# Patient Record
Sex: Male | Born: 1980 | Race: White | Hispanic: No | Marital: Married | Smoking: Former smoker
Health system: Southern US, Community
[De-identification: ages and names within clinical notes are randomized; demographics above are authoritative.]

## PROBLEM LIST (undated history)

## (undated) DIAGNOSIS — I10 Essential (primary) hypertension: Secondary | ICD-10-CM

## (undated) DIAGNOSIS — N200 Calculus of kidney: Secondary | ICD-10-CM

## (undated) HISTORY — PX: HERNIA REPAIR: SHX51

## (undated) HISTORY — PX: TONSILLECTOMY: SUR1361

## (undated) HISTORY — PX: WISDOM TOOTH EXTRACTION: SHX21

## (undated) HISTORY — PX: BACK SURGERY: SHX140

---

## 2016-11-11 ENCOUNTER — Encounter: Payer: Self-pay | Admitting: Emergency Medicine

## 2016-11-11 ENCOUNTER — Emergency Department (INDEPENDENT_AMBULATORY_CARE_PROVIDER_SITE_OTHER)
Admission: EM | Admit: 2016-11-11 | Discharge: 2016-11-11 | Disposition: A | Discharge status: Home / Self Care | Payer: Commercial Managed Care - PPO | Source: Home / Self Care | Attending: Family Medicine | Admitting: Family Medicine

## 2016-11-11 DIAGNOSIS — M791 Myalgia: Secondary | ICD-10-CM

## 2016-11-11 DIAGNOSIS — J111 Influenza due to unidentified influenza virus with other respiratory manifestations: Secondary | ICD-10-CM

## 2016-11-11 DIAGNOSIS — R05 Cough: Secondary | ICD-10-CM

## 2016-11-11 DIAGNOSIS — R51 Headache: Secondary | ICD-10-CM | POA: Diagnosis not present

## 2016-11-11 DIAGNOSIS — R509 Fever, unspecified: Secondary | ICD-10-CM | POA: Diagnosis not present

## 2016-11-11 DIAGNOSIS — R69 Illness, unspecified: Principal | ICD-10-CM

## 2016-11-11 HISTORY — DX: Essential (primary) hypertension: I10

## 2016-11-11 HISTORY — DX: Calculus of kidney: N20.0

## 2016-11-11 MED ORDER — OSELTAMIVIR PHOSPHATE 75 MG PO CAPS: 75.0000 mg | ORAL_CAPSULE | Freq: Two times a day (BID) | ORAL | 0 refills | Status: DC

## 2016-11-11 MED ORDER — GUAIFENESIN-CODEINE 100-10 MG/5ML PO SOLN: ORAL | 0 refills | Status: DC

## 2016-11-11 NOTE — ED Provider Notes (Signed)
[facility]    CSN: [id number] Arrival date & time: 11/11/16  1811     History   Chief Complaint Chief Complaint  Patient presents with  . Influenza    HPI Delphia GratesDerek Cain is a 36 y.o. male.   Complains of 2 day history flu-like illness including myalgias, headache, fever/chills, fatigue, and cough.  Also has mild nasal congestion and sore throat.  Cough is non-productive and somewhat worse at night.  No pleuritic pain or shortness of breath.  He has had loose stools.   The history is provided by the patient and the spouse.    Past Medical History:  Diagnosis Date  . Hypertension   . Kidney stones     There are no active problems to display for this patient.   Past Surgical History:  Procedure Laterality Date  . BACK SURGERY    . HERNIA REPAIR    . TONSILLECTOMY    . WISDOM TOOTH EXTRACTION         Home Medications    Prior to Admission medications   Medication Sig Start Date End Date Taking? Authorizing Provider  losartan (COZAAR) 50 MG tablet Take 50 mg by mouth daily.   Yes Historical Provider, MD  guaiFENesin-codeine 100-10 MG/5ML syrup Take 10mL by mouth at bedtime as needed for cough 11/11/16   Lattie HawStephen A Beese, MD  oseltamivir (TAMIFLU) 75 MG capsule Take 1 capsule (75 mg total) by mouth every 12 (twelve) hours. 11/11/16   Lattie HawStephen A Beese, MD    Family History No family history on file.  Social History Social History  Substance Use Topics  . Smoking status: Former Games developermoker  . Smokeless tobacco: Never Used  . Alcohol use Yes     Allergies   Patient has no known allergies.   Review of Systems Review of Systems + sore throat + cough No pleuritic pain No wheezing + nasal congestion + post-nasal drainage No sinus pain/pressure No itchy/red eyes No earache No hemoptysis No SOB No fever, + chills/sweats No nausea No vomiting No abdominal pain + diarrhea No urinary symptoms No skin rash + fatigue + myalgias +  headache Used OTC meds without relief   Physical Exam Triage Vital Signs ED Triage Vitals  Enc Vitals Group     BP 11/11/16 1855 129/91     Pulse Rate 11/11/16 1855 95     Resp --      Temp 11/11/16 1855 98 F (36.7 C)     Temp Source 11/11/16 1855 Oral     SpO2 11/11/16 1855 98 %     Weight 11/11/16 1855 211 lb (95.7 kg)     Height 11/11/16 1855 6' (1.829 m)     Head Circumference --      Peak Flow --      Pain Score 11/11/16 1900 2     Pain Loc --      Pain Edu? --      Excl. in GC? --    No data found.   Updated Vital Signs BP 129/91 (BP Location: Left Arm)   Pulse 95   Temp 98 F (36.7 C) (Oral)   Ht 6' (1.829 m)   Wt 211 lb (95.7 kg)   SpO2 98%   BMI 28.62 kg/m   Visual Acuity Right Eye Distance:   Left Eye Distance:   Bilateral Distance:    Right Eye Near:   Left Eye Near:    Bilateral Near:     Physical Exam  Nursing notes and Vital Signs reviewed. Appearance:  Patient appears stated age, and in no acute distress Eyes:  Pupils are equal, round, and reactive to light and accomodation.  Extraocular movement is intact.  Conjunctivae are not inflamed  Ears:  Canals normal.  Tympanic membranes normal.  Nose:  Mildly congested turbinates.  No sinus tenderness  Pharynx:  Normal Neck:  Supple.  Tender enlarged posterior/lateral nodes are palpated bilaterally  Lungs:  Clear to auscultation.  Breath sounds are equal.  Moving air well. Heart:  Regular rate and rhythm without murmurs, rubs, or gallops.  Abdomen:  Nontender without masses or hepatosplenomegaly.  Bowel sounds are present.  No CVA or flank tenderness.  Extremities:  No edema.  Skin:  No rash present.    UC Treatments / Results  Labs (all labs ordered are listed, but only abnormal results are displayed) Labs Reviewed - No data to display  EKG  EKG Interpretation None       Radiology No results found.  Procedures Procedures (including critical care time)  Medications Ordered in  UC Medications - No data to display   Initial Impression / Assessment and Plan / UC Course  I have reviewed the triage vital signs and the nursing notes.  Pertinent labs & imaging results that were available during my care of the patient were reviewed by me and considered in my medical decision making (see chart for details).    Begin Tamiflu. Rx for Robitussin AC for night time cough.  Take plain guaifenesin (1200mg  extended release tabs such as Mucinex) twice daily, with plenty of water, for cough and congestion.  Get adequate rest.   May use Afrin nasal spray (or generic oxymetazoline) twice daily for about 5 days and then discontinue.  Also recommend using saline nasal spray several times daily and saline nasal irrigation (AYR is a common brand).  Use Flonase nasal spray each morning after using Afrin nasal spray and saline nasal irrigation. Try warm salt water gargles for sore throat.  Stop all antihistamines for now, and other non-prescription cough/cold preparations. May take Ibuprofen 200mg , 4 tabs every 8 hours with food for body aches, headache, etc.  Begin clear liquids (Pedialyte while having diarrhea) until improved, then advance to a [employer] (Bananas, Rice, Applesauce, Toast).  Then gradually resume a regular diet when tolerated.  Avoid milk products until well.    If symptoms become significantly worse during the night or over the weekend, proceed to the local emergency room.  Followup with Family Doctor if not improved in one week.        Final Clinical Impressions(s) / UC Diagnoses   Final diagnoses:  Influenza-like illness    New Prescriptions New Prescriptions   GUAIFENESIN-CODEINE 100-10 MG/5ML SYRUP    Take 10mL by mouth at bedtime as needed for cough   OSELTAMIVIR (TAMIFLU) 75 MG CAPSULE    Take 1 capsule (75 mg total) by mouth every 12 (twelve) hours.     Lattie Haw, MD 11/11/16 [phone]

## 2016-11-11 NOTE — Discharge Instructions (Signed)
Take plain guaifenesin (1200mg  extended release tabs such as Mucinex) twice daily, with plenty of water, for cough and congestion.  Get adequate rest.   May use Afrin nasal spray (or generic oxymetazoline) twice daily for about 5 days and then discontinue.  Also recommend using saline nasal spray several times daily and saline nasal irrigation (AYR is a common brand).  Use Flonase nasal spray each morning after using Afrin nasal spray and saline nasal irrigation. Try warm salt water gargles for sore throat.  Stop all antihistamines for now, and other non-prescription cough/cold preparations. May take Ibuprofen 200mg , 4 tabs every 8 hours with food for body aches, headache, etc.  Begin clear liquids (Pedialyte while having diarrhea) until improved, then advance to a [employer] diet (Bananas, Rice, Applesauce, Toast).  Then gradually resume a regular diet when tolerated.  Avoid milk products until well.    If symptoms become significantly worse during the night or over the weekend, proceed to the local emergency room.

## 2016-11-11 NOTE — ED Triage Notes (Signed)
Cough, body aches, chest hurts, headache, congestion x 2 days

## 2016-11-25 ENCOUNTER — Other Ambulatory Visit: Payer: Self-pay | Admitting: Neurosurgery

## 2016-11-25 DIAGNOSIS — M5412 Radiculopathy, cervical region: Secondary | ICD-10-CM

## 2016-11-25 DIAGNOSIS — M5416 Radiculopathy, lumbar region: Secondary | ICD-10-CM

## 2016-12-09 ENCOUNTER — Ambulatory Visit (INDEPENDENT_AMBULATORY_CARE_PROVIDER_SITE_OTHER): Payer: Commercial Managed Care - PPO

## 2016-12-09 DIAGNOSIS — M4802 Spinal stenosis, cervical region: Secondary | ICD-10-CM

## 2016-12-09 DIAGNOSIS — M50121 Cervical disc disorder at C4-C5 level with radiculopathy: Secondary | ICD-10-CM

## 2016-12-09 DIAGNOSIS — M5416 Radiculopathy, lumbar region: Secondary | ICD-10-CM

## 2016-12-09 DIAGNOSIS — M5412 Radiculopathy, cervical region: Secondary | ICD-10-CM

## 2016-12-09 DIAGNOSIS — M5126 Other intervertebral disc displacement, lumbar region: Secondary | ICD-10-CM

## 2017-11-25 ENCOUNTER — Emergency Department (INDEPENDENT_AMBULATORY_CARE_PROVIDER_SITE_OTHER)
Admission: EM | Admit: 2017-11-25 | Discharge: 2017-11-25 | Disposition: A | Discharge status: Home / Self Care | Payer: Commercial Managed Care - PPO | Source: Home / Self Care | Attending: Family Medicine | Admitting: Family Medicine

## 2017-11-25 ENCOUNTER — Encounter: Payer: Self-pay | Admitting: *Deleted

## 2017-11-25 ENCOUNTER — Other Ambulatory Visit: Payer: Self-pay

## 2017-11-25 DIAGNOSIS — J111 Influenza due to unidentified influenza virus with other respiratory manifestations: Secondary | ICD-10-CM | POA: Diagnosis not present

## 2017-11-25 DIAGNOSIS — R69 Illness, unspecified: Principal | ICD-10-CM

## 2017-11-25 MED ORDER — BENZONATATE 200 MG PO CAPS: ORAL_CAPSULE | ORAL | 0 refills | Status: DC

## 2017-11-25 MED ORDER — OSELTAMIVIR PHOSPHATE 75 MG PO CAPS: 75.0000 mg | ORAL_CAPSULE | Freq: Two times a day (BID) | ORAL | 0 refills | Status: DC

## 2017-11-25 NOTE — ED Provider Notes (Signed)
[facility]    CSN: [id number] Arrival date & time: 11/25/17  1103     History   Chief Complaint Chief Complaint  Patient presents with  . Headache  . Generalized Body Aches    HPI Jorge Cain is a 37 y.o. male.   Patient developed nasal congestion two days ago.  Yesterday he developed increased sinus congestion, headache, chest tightness, cough, myalgias, fatigue, and sweats.  No pleuritic pain or shortness of breath.   The history is provided by the patient.    Past Medical History:  Diagnosis Date  . Hypertension   . Kidney stones     There are no active problems to display for this patient.   Past Surgical History:  Procedure Laterality Date  . BACK SURGERY    . HERNIA REPAIR    . TONSILLECTOMY    . WISDOM TOOTH EXTRACTION         Home Medications    Prior to Admission medications   Medication Sig Start Date End Date Taking? Authorizing Provider  benzonatate (TESSALON) 200 MG capsule Take one cap by mouth at bedtime as needed for cough.  May repeat in 4 to 6 hours 11/25/17   Lattie HawBeese, Aasiyah Auerbach A, MD  losartan (COZAAR) 50 MG tablet Take 50 mg by mouth daily.    [provider]  oseltamivir (TAMIFLU) 75 MG capsule Take 1 capsule (75 mg total) by mouth every 12 (twelve) hours. 11/25/17   Lattie HawBeese, Iretta Mangrum A, MD    Family History History reviewed. No pertinent family history.  Social History Social History   Tobacco Use  . Smoking status: Former Games developermoker  . Smokeless tobacco: Never Used  Substance Use Topics  . Alcohol use: Yes  . Drug use: No     Allergies   Patient has no known allergies.   Review of Systems Review of Systems + sore throat + cough No pleuritic pain No wheezing + nasal congestion + post-nasal drainage No sinus pain/pressure No itchy/red eyes ? earache No hemoptysis No SOB No fever, + chills/sweats No nausea No vomiting No abdominal pain No diarrhea No urinary symptoms No skin rash + fatigue +  myalgias + headache Used OTC meds without relief   Physical Exam Triage Vital Signs ED Triage Vitals  Enc Vitals Group     BP 11/25/17 1152 (!) 132/95     Pulse Rate 11/25/17 1152 78     Resp 11/25/17 1152 18     Temp 11/25/17 1152 97.9 F (36.6 C)     Temp Source 11/25/17 1152 Oral     SpO2 11/25/17 1152 97 %     Weight 11/25/17 1153 222 lb (100.7 kg)     Height 11/25/17 1153 6' (1.829 m)     Head Circumference --      Peak Flow --      Pain Score 11/25/17 1153 0     Pain Loc --      Pain Edu? --      Excl. in GC? --    No data found.  Updated Vital Signs BP (!) 132/95 (BP Location: Right Arm)   Pulse 78   Temp 97.9 F (36.6 C) (Oral)   Resp 18   Ht 6' (1.829 m)   Wt 222 lb (100.7 kg)   SpO2 97%   BMI 30.11 kg/m   Visual Acuity Right Eye Distance:   Left Eye Distance:   Bilateral Distance:    Right Eye Near:   Left Eye  Near:    Bilateral Near:     Physical Exam Nursing notes and Vital Signs reviewed. Appearance:  Patient appears stated age, and in no acute distress Eyes:  Pupils are equal, round, and reactive to light and accomodation.  Extraocular movement is intact.  Conjunctivae are not inflamed  Ears:  Canals normal.  Tympanic membranes normal.  Nose:  Mildly congested turbinates.  No sinus tenderness.  Pharynx:  Normal Neck:  Supple.  Enlarged posterior/lateral nodes are palpated bilaterally, tender to palpation on the left.   Lungs:  Clear to auscultation.  Breath sounds are equal.  Moving air well. Chest:  Distinct tenderness to palpation over the mid-sternum.  Heart:  Regular rate and rhythm without murmurs, rubs, or gallops.  Abdomen:  Nontender without masses or hepatosplenomegaly.  Bowel sounds are present.  No CVA or flank tenderness.  Extremities:  No edema.  Skin:  No rash present.    UC Treatments / Results  Labs (all labs ordered are listed, but only abnormal results are displayed) Labs Reviewed - No data to display  EKG  EKG  Interpretation None       Radiology No results found.  Procedures Procedures (including critical care time)  Medications Ordered in UC Medications - No data to display   Initial Impression / Assessment and Plan / UC Course  I have reviewed the triage vital signs and the nursing notes.  Pertinent labs & imaging results that were available during my care of the patient were reviewed by me and considered in my medical decision making (see chart for details).    Begin Tamiflu. Prescription written for Benzonatate [facility]) to take at bedtime for night-time cough.  Take plain guaifenesin (1200mg  extended release tabs such as Mucinex) twice daily, with plenty of water, for cough and congestion.  Get adequate rest.   May use Afrin nasal spray (or generic oxymetazoline) each morning for about 5 days and then discontinue.  Also recommend using saline nasal spray several times daily and saline nasal irrigation (AYR is a common brand).  Use Flonase nasal spray each morning after using Afrin nasal spray and saline nasal irrigation. Try warm salt water gargles for sore throat.  Stop all antihistamines for now, and other non-prescription cough/cold preparations. May take Ibuprofen 200mg , 4 tabs every 8 hours with food for chest/sternum discomfort, headache, fever, etc. May take Delsym Cough Suppressant with Tessalon at bedtime for nighttime cough.   Followup with Family Doctor if not improved in one week.     Final Clinical Impressions(s) / UC Diagnoses   Final diagnoses:  Influenza-like illness    ED Discharge Orders        Ordered    oseltamivir (TAMIFLU) 75 MG capsule  Every 12 hours     11/25/17 1247    benzonatate (TESSALON) 200 MG capsule     11/25/17 1247          Lattie Haw, MD 11/25/17 1252

## 2017-11-25 NOTE — ED Triage Notes (Signed)
Pt c/o body aches, HA, runny nose and sweats x 1 day. No OTC meds.

## 2017-11-25 NOTE — Discharge Instructions (Signed)
Take plain guaifenesin (1200mg  extended release tabs such as Mucinex) twice daily, with plenty of water, for cough and congestion.  Get adequate rest.   May use Afrin nasal spray (or generic oxymetazoline) each morning for about 5 days and then discontinue.  Also recommend using saline nasal spray several times daily and saline nasal irrigation (AYR is a common brand).  Use Flonase nasal spray each morning after using Afrin nasal spray and saline nasal irrigation. Try warm salt water gargles for sore throat.  Stop all antihistamines for now, and other non-prescription cough/cold preparations. May take Ibuprofen 200mg , 4 tabs every 8 hours with food for chest/sternum discomfort, headache, fever, etc. May take Delsym Cough Suppressant with Tessalon at bedtime for nighttime cough.

## 2019-02-01 IMAGING — MR MR CERVICAL SPINE W/O CM
5 series · 33 of 48 positions shown · non-contrast
Comparison: Plain films cervical spine 11/20/2016 performed at
[HOSPITAL] [REDACTED].

CLINICAL DATA: Neck pain, popping and stiffness radiating into the
left arm to the hand. No known injury.

EXAM:
MRI CERVICAL SPINE WITHOUT CONTRAST
TECHNIQUE: Multiplanar, multisequence MR imaging of the cervical spine was
performed. No intravenous contrast was administered.

[Series 3: T1 · sagittal · 3.0mm · 0.70mm/px · 6 of 13 slices shown]
[im 1/13]
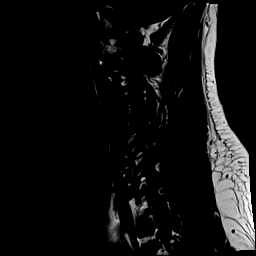
[im 3/13]
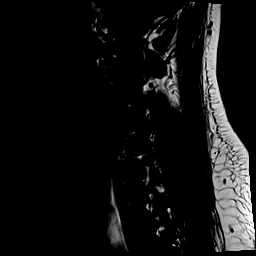
[im 5/13]
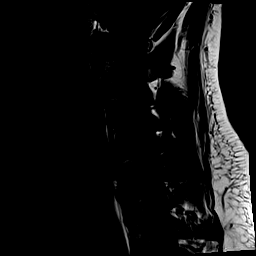
[im 8/13]
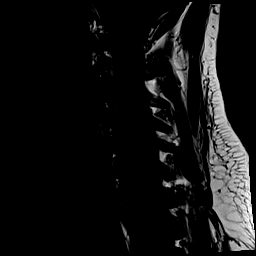
[im 10/13]
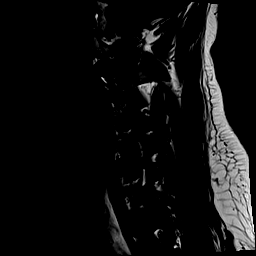
[im 13/13]
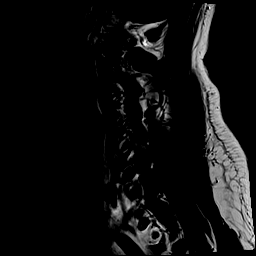

[Series 4: STIR · sagittal · 3.0mm · 0.35mm/px · 7 of 13 slices shown]
[im 1/13]
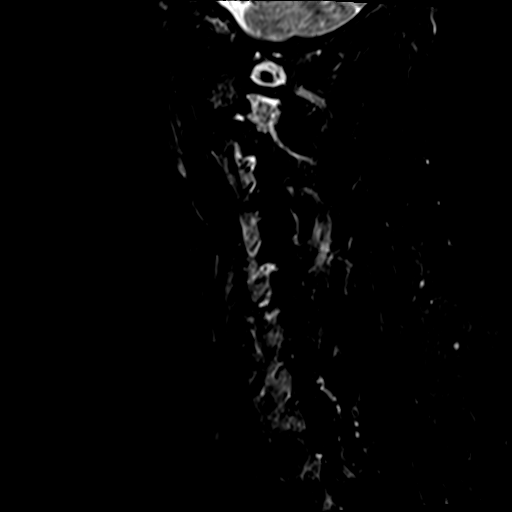
[im 3/13]
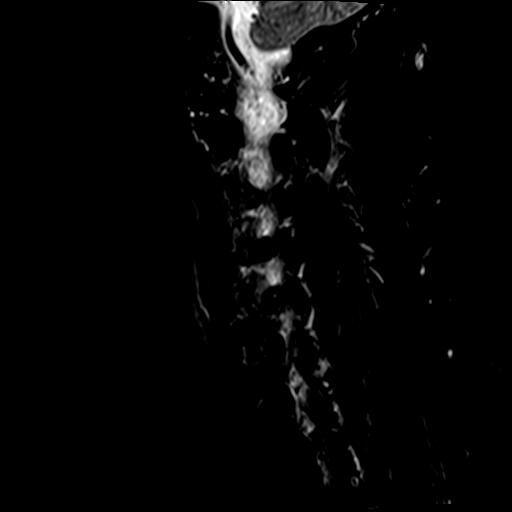
[im 5/13]
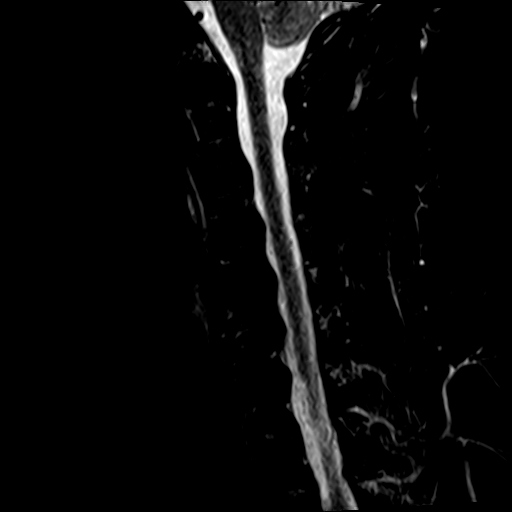
[im 7/13]
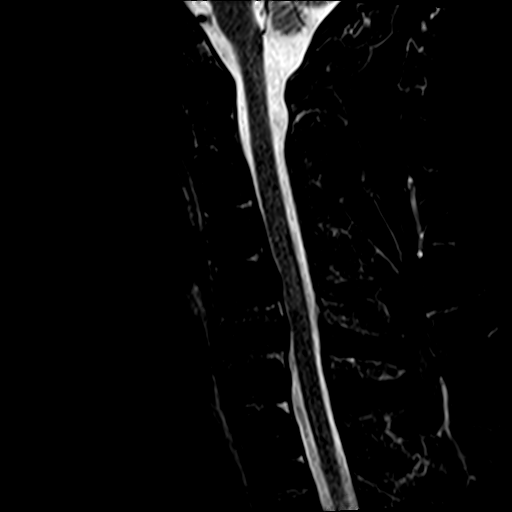
[im 9/13]
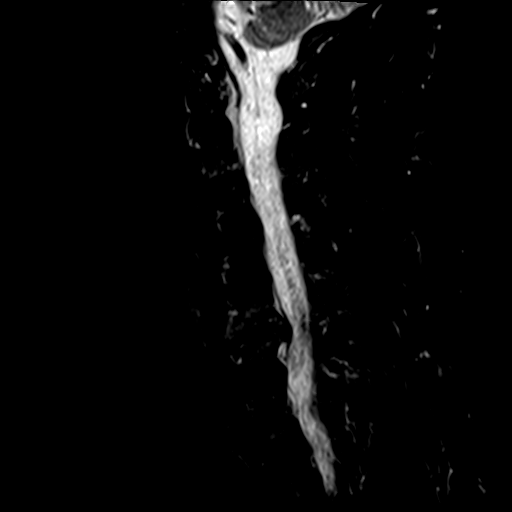
[im 11/13]
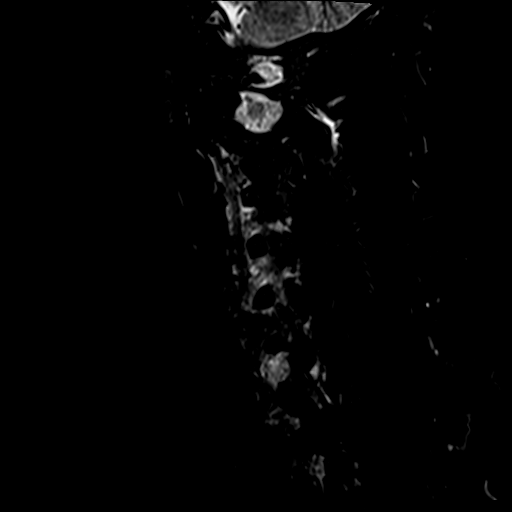
[im 13/13]
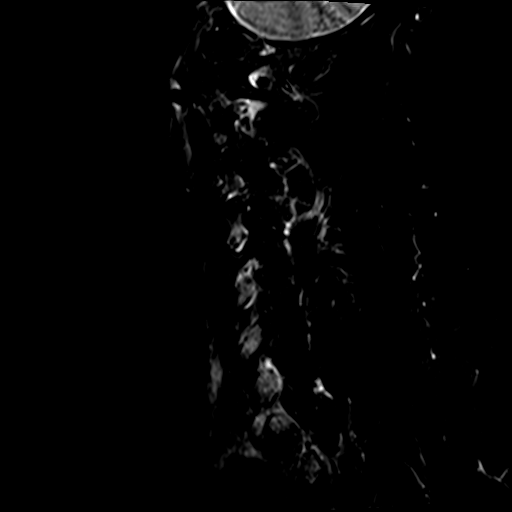

[Series 5: T2 · axial · 3.0mm · 0.62mm/px · z∈[-86,+16]mm · 8 of 28 slices shown (1 of 2)]
[im 1/28]
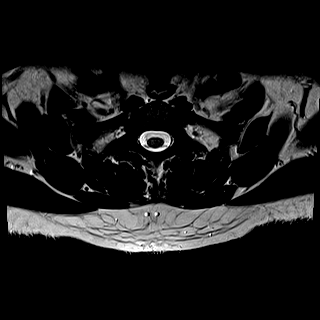
[im 5/28]
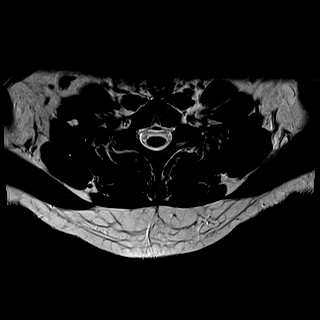
[im 9/28]
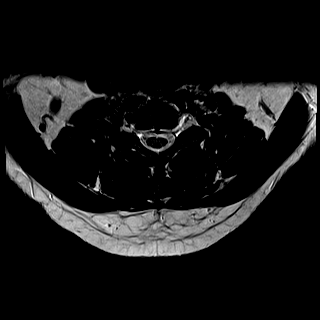
[im 13/28]
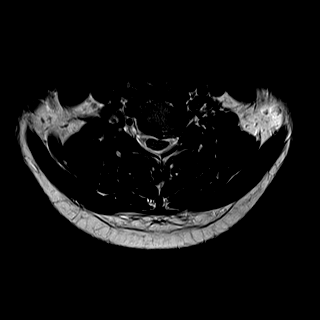
[im 15/28]
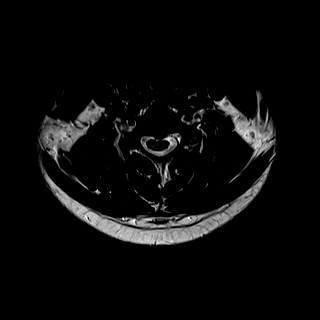
[im 19/28]
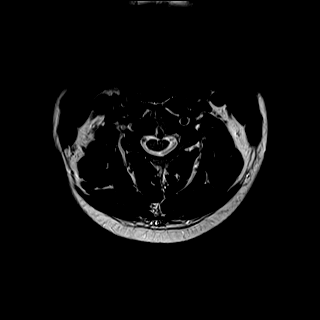
[im 23/28]
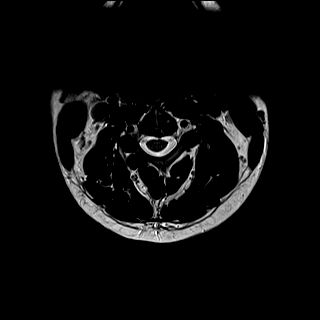
[im 28/28]
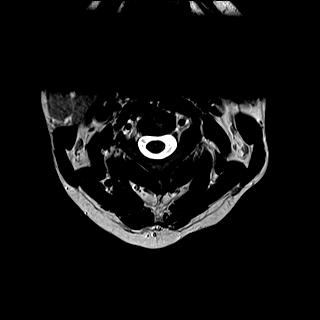

[Series 6: mpgr ax · axial · 3.0mm · 0.37mm/px · z∈[-80,-27]mm · 5 of 28 slices shown]
[im 1/28]
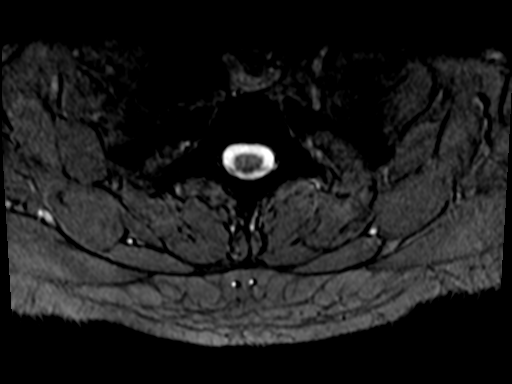
[im 5/28]
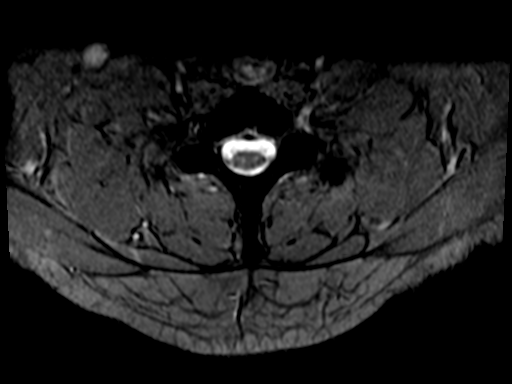
[im 9/28]
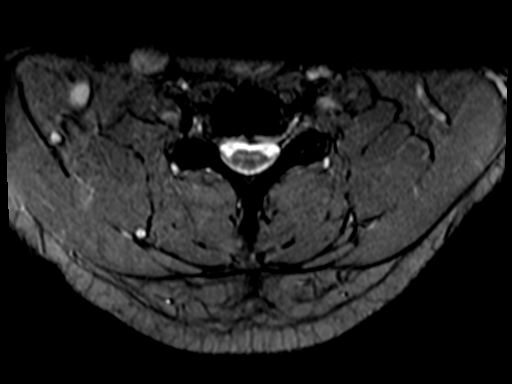
[im 13/28]
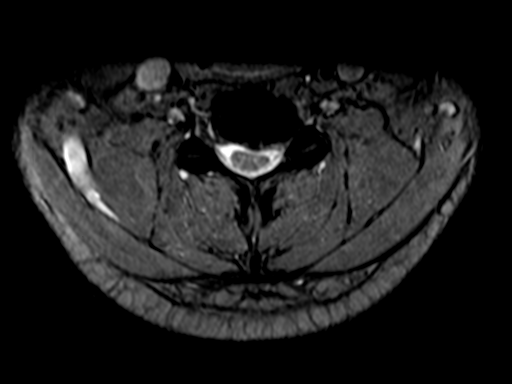
[im 15/28]
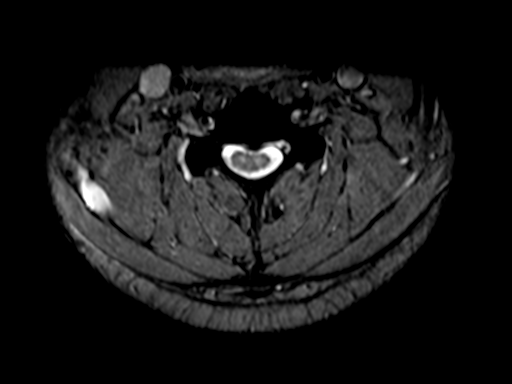

[Series 100: T2 · sagittal · 3.0mm · 0.56mm/px · 7 of 13 slices shown (2 of 2)]
[im 1/13]
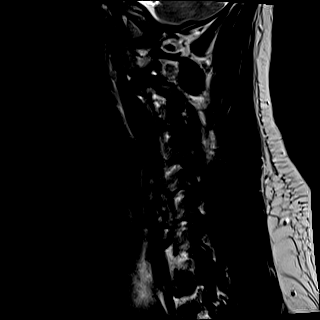
[im 3/13]
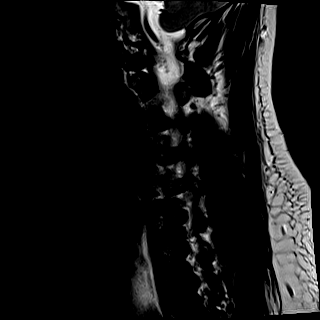
[im 5/13]
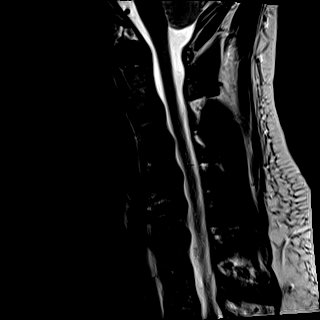
[im 7/13]
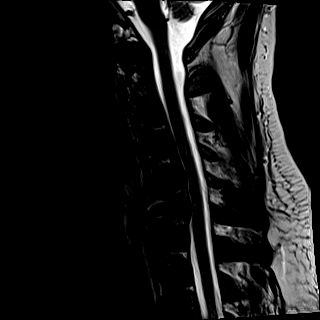
[im 9/13]
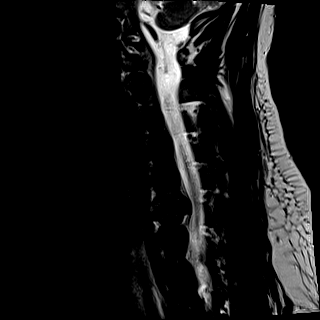
[im 11/13]
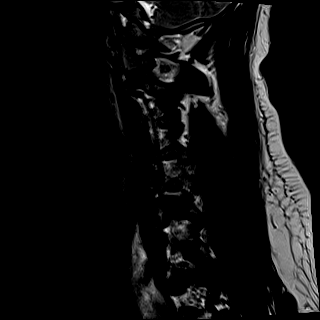
[im 13/13]
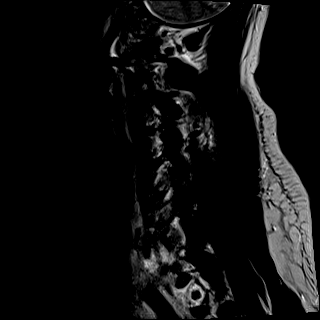

[33 of 48 positions shown; findings below may reference images not displayed]

FINDINGS: Alignment: Maintained.  The neck is in mild flexion.

Vertebrae: No fracture or worrisome lesion. Degenerative endplate
signal change is seen at C5-6 somewhat worse to the left.

Cord: Normal signal throughout.

Posterior Fossa, vertebral arteries, paraspinal tissues: Negative.

Disc levels:

C2-3:  Negative.

C3-4: Minimal disc bulge without central canal or foraminal
stenosis.

C4-5: Central disc protrusion contacts and slightly deforms the
ventral cord. The central canal and foramina appear widely patent.

C5-6: Broad-based left paracentral protrusion extends into the left
foramen causing severe foraminal stenosis. The disc slightly deforms
the left hemicord. The right foramen is open.

C6-7:  Shallow disc bulge without stenosis.

C7-T1:  Negative.
IMPRESSION: Broad-based left paracentral protrusion extending into the left
foramen at C5-6 causes severe foraminal stenosis and impingement on
the exiting left C6 root. The disc mildly deforms the left hemicord.

Central protrusion at C4-5 slightly indents the ventral cord but the
central canal and foramina appear open.

## 2021-08-01 ENCOUNTER — Emergency Department (INDEPENDENT_AMBULATORY_CARE_PROVIDER_SITE_OTHER)
Admission: EM | Admit: 2021-08-01 | Discharge: 2021-08-01 | Disposition: A | Discharge status: Home / Self Care | Payer: Commercial Managed Care - PPO | Source: Home / Self Care | Attending: Family Medicine | Admitting: Family Medicine

## 2021-08-01 ENCOUNTER — Other Ambulatory Visit: Payer: Self-pay

## 2021-08-01 DIAGNOSIS — J40 Bronchitis, not specified as acute or chronic: Secondary | ICD-10-CM | POA: Diagnosis not present

## 2021-08-01 MED ORDER — BENZONATATE 200 MG PO CAPS: 200.0000 mg | ORAL_CAPSULE | Freq: Three times a day (TID) | ORAL | 0 refills | Status: AC | PRN

## 2021-08-01 MED ORDER — AZITHROMYCIN 250 MG PO TABS: ORAL_TABLET | ORAL | 0 refills | Status: AC

## 2021-08-01 NOTE — Discharge Instructions (Signed)
Take the azithromycin as directed.  This is a Z-Pak for the bronchitis infection Take Tessalon 2-3 times a day for cough.  May take Mucinex DM in addition if needed I have written you a note to go back on Friday.  Call if the note needs to be extended

## 2021-08-01 NOTE — ED Provider Notes (Signed)
[facility]    CSN: [id number] Arrival date & time: 08/01/21  1548      History   Chief Complaint Chief Complaint  Patient presents with   Cough   Nasal Congestion    HPI Jorge Cain is a 40 y.o. male.   HPI  Patient and wife have both been sick with an upper respiratory infection, sinus congestion nasal congestion, postnasal drip, cough, he states he is coughing up green thick sputum.  He feels tired.  He has not had any headache but does have some body aches.  He has had fever but did not take his temperature.  His wife went to her doctor and was given an antibiotic.  She was told she had "bronchitis".  Past Medical History:  Diagnosis Date   Hypertension    Kidney stones     There are no problems to display for this patient.   Past Surgical History:  Procedure Laterality Date   BACK SURGERY     HERNIA REPAIR     TONSILLECTOMY     WISDOM TOOTH EXTRACTION         Home Medications    Prior to Admission medications   Medication Sig Start Date End Date Taking? Authorizing Provider  azithromycin (ZITHROMAX Z-PAK) 250 MG tablet Take two pills today followed by one a day until gone 08/01/21  Yes Eustace Moore, MD  benzonatate (TESSALON) 200 MG capsule Take 1 capsule (200 mg total) by mouth 3 (three) times daily as needed for cough. 08/01/21  Yes Eustace Moore, MD  losartan (COZAAR) 50 MG tablet Take 50 mg by mouth daily.    [provider]    Family History History reviewed. No pertinent family history.  Social History Social History   Tobacco Use   Smoking status: Former   Smokeless tobacco: Never  Building services engineer Use: Never used  Substance Use Topics   Alcohol use: Yes   Drug use: No     Allergies   Patient has no known allergies.   Review of Systems Review of Systems   Physical Exam Triage Vital Signs ED Triage Vitals  Enc Vitals Group     BP 08/01/21 1602 (!) 135/95     Pulse Rate 08/01/21 1602 96      Resp 08/01/21 1602 17     Temp 08/01/21 1602 98.3 F (36.8 C)     Temp Source 08/01/21 1602 Oral     SpO2 08/01/21 1602 98 %     Weight --      Height --      Head Circumference --      Peak Flow --      Pain Score 08/01/21 1603 0     Pain Loc --      Pain Edu? --      Excl. in GC? --    No data found.  Updated Vital Signs BP (!) 135/95 (BP Location: Left Arm)   Pulse 96   Temp 98.3 F (36.8 C) (Oral)   Resp 17   SpO2 98%      Physical Exam Constitutional:      General: He is not in acute distress.    Appearance: He is well-developed. He is ill-appearing.  HENT:     Head: Normocephalic and atraumatic.     Right Ear: Tympanic membrane and ear canal normal.     Left Ear: Tympanic membrane and ear canal normal.     Nose: Congestion  and rhinorrhea present.     Mouth/Throat:     Pharynx: No posterior oropharyngeal erythema.  Eyes:     Conjunctiva/sclera: Conjunctivae normal.     Pupils: Pupils are equal, round, and reactive to light.  Cardiovascular:     Rate and Rhythm: Normal rate and regular rhythm.     Heart sounds: Normal heart sounds.  Pulmonary:     Effort: Pulmonary effort is normal. No respiratory distress.     Breath sounds: Rhonchi present.  Abdominal:     General: There is no distension.     Palpations: Abdomen is soft.  Musculoskeletal:        General: Normal range of motion.     Cervical back: Normal range of motion.  Skin:    General: Skin is warm and dry.  Neurological:     Mental Status: He is alert.     UC Treatments / Results  Labs (all labs ordered are listed, but only abnormal results are displayed) Labs Reviewed - No data to display  EKG   Radiology No results found.  Procedures Procedures (including critical care time)  Medications Ordered in UC Medications - No data to display  Initial Impression / Assessment and Plan / UC Course  I have reviewed the triage vital signs and the nursing notes.  Pertinent labs & imaging  results that were available during my care of the patient were reviewed by me and considered in my medical decision making (see chart for details).     I explained to the patient that most bronchitis is caused by a virus.  After 7 to 10 days if the symptoms persist or worsen then antibiotic is indicated.  Symptomatic care reviewed Final Clinical Impressions(s) / UC Diagnoses   Final diagnoses:  Bronchitis     Discharge Instructions      Take the azithromycin as directed.  This is a Z-Pak for the bronchitis infection Take Tessalon 2-3 times a day for cough.  May take Mucinex DM in addition if needed I have written you a note to go back on Friday.  Call if the note needs to be extended   ED Prescriptions     Medication Sig Dispense Auth. Provider   azithromycin (ZITHROMAX Z-PAK) 250 MG tablet Take two pills today followed by one a day until gone 6 tablet Eustace Moore, MD   benzonatate (TESSALON) 200 MG capsule Take 1 capsule (200 mg total) by mouth 3 (three) times daily as needed for cough. 21 capsule Eustace Moore, MD      PDMP not reviewed this encounter.   Eustace Moore, MD 08/01/21 [phone]

## 2021-08-01 NOTE — ED Triage Notes (Signed)
Pt c/o cough and congestion x 3 days. Green/brown mucous. Denies fever. Taking nyquil prn. Wife sick with bronchitis. COVID test neg last night.
